# Patient Record
Sex: Male | Born: 1980 | Race: Black or African American | Hispanic: No | Marital: Single | Smoking: Current every day smoker
Health system: Southern US, Community
[De-identification: ages and names within clinical notes are randomized; demographics above are authoritative.]

## PROBLEM LIST (undated history)

## (undated) DIAGNOSIS — J45909 Unspecified asthma, uncomplicated: Secondary | ICD-10-CM

---

## 2002-12-28 ENCOUNTER — Emergency Department (HOSPITAL_COMMUNITY): Admission: EM | Admit: 2002-12-28 | Discharge: 2002-12-28 | Payer: Self-pay

## 2003-10-03 ENCOUNTER — Emergency Department (HOSPITAL_COMMUNITY): Admission: EM | Admit: 2003-10-03 | Discharge: 2003-10-03 | Payer: Self-pay | Admitting: Emergency Medicine

## 2006-07-15 ENCOUNTER — Emergency Department (HOSPITAL_COMMUNITY): Admission: EM | Admit: 2006-07-15 | Discharge: 2006-07-16 | Payer: Self-pay | Admitting: Emergency Medicine

## 2007-04-15 ENCOUNTER — Emergency Department (HOSPITAL_COMMUNITY): Admission: EM | Admit: 2007-04-15 | Discharge: 2007-04-15 | Payer: Self-pay | Admitting: Emergency Medicine

## 2011-11-02 ENCOUNTER — Emergency Department (HOSPITAL_COMMUNITY)
Admission: EM | Admit: 2011-11-02 | Discharge: 2011-11-02 | Disposition: A | Discharge status: Home / Self Care | Payer: No Typology Code available for payment source | Attending: Emergency Medicine | Admitting: Emergency Medicine

## 2011-11-02 ENCOUNTER — Emergency Department (HOSPITAL_COMMUNITY): Payer: No Typology Code available for payment source

## 2011-11-02 ENCOUNTER — Encounter (HOSPITAL_COMMUNITY): Payer: Self-pay | Admitting: Emergency Medicine

## 2011-11-02 DIAGNOSIS — J45909 Unspecified asthma, uncomplicated: Secondary | ICD-10-CM | POA: Insufficient documentation

## 2011-11-02 DIAGNOSIS — M549 Dorsalgia, unspecified: Secondary | ICD-10-CM | POA: Insufficient documentation

## 2011-11-02 DIAGNOSIS — F17200 Nicotine dependence, unspecified, uncomplicated: Secondary | ICD-10-CM | POA: Insufficient documentation

## 2011-11-02 DIAGNOSIS — M542 Cervicalgia: Secondary | ICD-10-CM | POA: Insufficient documentation

## 2011-11-02 HISTORY — DX: Unspecified asthma, uncomplicated: J45.909

## 2011-11-02 MED ORDER — HYDROMORPHONE HCL PF 1 MG/ML IJ SOLN
1.0000 mg | Freq: Once | INTRAMUSCULAR | Status: AC
  Administered 2011-11-02: 1 mg via INTRAVENOUS
  Filled 2011-11-02: qty 1

## 2011-11-02 MED ORDER — KETOROLAC TROMETHAMINE 30 MG/ML IJ SOLN
30.0000 mg | Freq: Once | INTRAMUSCULAR | Status: AC
  Administered 2011-11-02: 30 mg via INTRAVENOUS
  Filled 2011-11-02: qty 1

## 2011-11-02 MED ORDER — ALBUTEROL SULFATE HFA 108 (90 BASE) MCG/ACT IN AERS: 2.0000 | INHALATION_SPRAY | RESPIRATORY_TRACT | Status: DC | PRN

## 2011-11-02 MED ORDER — CYCLOBENZAPRINE HCL 10 MG PO TABS: 10.0000 mg | ORAL_TABLET | Freq: Three times a day (TID) | ORAL | Status: DC | PRN

## 2011-11-02 MED ORDER — IBUPROFEN 800 MG PO TABS: 800.0000 mg | ORAL_TABLET | Freq: Three times a day (TID) | ORAL | Status: DC

## 2011-11-02 MED ORDER — CYCLOBENZAPRINE HCL 10 MG PO TABS
10.0000 mg | ORAL_TABLET | Freq: Once | ORAL | Status: AC
  Administered 2011-11-02: 10 mg via ORAL
  Filled 2011-11-02: qty 1

## 2011-11-02 MED ORDER — ONDANSETRON HCL 4 MG/2ML IJ SOLN
4.0000 mg | Freq: Once | INTRAMUSCULAR | Status: AC
  Administered 2011-11-02: 4 mg via INTRAVENOUS
  Filled 2011-11-02: qty 2

## 2011-11-02 MED ORDER — HYDROCODONE-ACETAMINOPHEN 5-325 MG PO TABS: 1.0000 | ORAL_TABLET | Freq: Four times a day (QID) | ORAL | Status: DC | PRN

## 2011-11-02 NOTE — ED Notes (Signed)
EDP into room 

## 2011-11-02 NOTE — ED Notes (Signed)
No changes, to xray.
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
## 2011-11-02 NOTE — ED Notes (Signed)
No changes, "neck feels better", resting.
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
## 2011-11-02 NOTE — ED Notes (Signed)
Pt refused to sign at d/c until he could read all paperwork. Out in w/c to d/c desk and parking lot, out with RN & friend, given Rx x3.

## 2011-11-03 ENCOUNTER — Encounter (HOSPITAL_COMMUNITY): Payer: Self-pay | Admitting: Emergency Medicine

## 2011-11-03 ENCOUNTER — Emergency Department (HOSPITAL_COMMUNITY)
Admission: EM | Admit: 2011-11-03 | Discharge: 2011-11-04 | Disposition: A | Discharge status: Home / Self Care | Payer: No Typology Code available for payment source | Attending: Emergency Medicine | Admitting: Emergency Medicine

## 2011-11-03 DIAGNOSIS — J45909 Unspecified asthma, uncomplicated: Secondary | ICD-10-CM | POA: Insufficient documentation

## 2011-11-03 DIAGNOSIS — F17200 Nicotine dependence, unspecified, uncomplicated: Secondary | ICD-10-CM | POA: Insufficient documentation

## 2011-11-03 DIAGNOSIS — M25569 Pain in unspecified knee: Secondary | ICD-10-CM | POA: Insufficient documentation

## 2011-11-03 DIAGNOSIS — T148XXA Other injury of unspecified body region, initial encounter: Secondary | ICD-10-CM | POA: Insufficient documentation

## 2011-11-03 MED ORDER — HYDROCODONE-ACETAMINOPHEN 5-325 MG PO TABS
1.0000 | ORAL_TABLET | Freq: Once | ORAL | Status: AC
  Administered 2011-11-04: 1 via ORAL
  Filled 2011-11-03: qty 1
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
## 2011-11-04 NOTE — Progress Notes (Signed)
Orthopedic Tech Progress Note Patient Details:  John Sellers [birth year] [id number]  Ortho Devices Type of Ortho Device: Crutches;Knee Sleeve   [name] 11/04/2011, 12:04 AM

## 2011-11-06 NOTE — ED Provider Notes (Signed)
Medical screening examination/treatment/procedure(s) were performed by non-physician practitioner and as supervising physician I was immediately available for consultation/collaboration.  Cecilee Rosner, MD 11/06/11 1115 
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
## 2013-05-03 ENCOUNTER — Emergency Department (HOSPITAL_COMMUNITY)
Admission: EM | Admit: 2013-05-03 | Discharge: 2013-05-03 | Disposition: A | Discharge status: Home / Self Care | Payer: Self-pay | Source: Home / Self Care | Attending: Emergency Medicine | Admitting: Emergency Medicine

## 2013-05-03 ENCOUNTER — Other Ambulatory Visit (HOSPITAL_COMMUNITY)
Admission: RE | Admit: 2013-05-03 | Discharge: 2013-05-03 | Disposition: A | Discharge status: Home / Self Care | Payer: Self-pay | Source: Ambulatory Visit | Attending: Emergency Medicine | Admitting: Emergency Medicine

## 2013-05-03 ENCOUNTER — Encounter (HOSPITAL_COMMUNITY): Payer: Self-pay | Admitting: Emergency Medicine

## 2013-05-03 DIAGNOSIS — N342 Other urethritis: Secondary | ICD-10-CM

## 2013-05-03 DIAGNOSIS — Z113 Encounter for screening for infections with a predominantly sexual mode of transmission: Secondary | ICD-10-CM | POA: Insufficient documentation

## 2013-05-03 DIAGNOSIS — R109 Unspecified abdominal pain: Secondary | ICD-10-CM

## 2013-05-03 LAB — POCT URINALYSIS DIP (DEVICE)
Bilirubin Urine: NEGATIVE
Glucose, UA: NEGATIVE mg/dL
Ketones, ur: NEGATIVE mg/dL
LEUKOCYTES UA: NEGATIVE
Nitrite: NEGATIVE
PROTEIN: NEGATIVE mg/dL
Specific Gravity, Urine: 1.02 (ref 1.005–1.030)
UROBILINOGEN UA: 2 mg/dL — AB (ref 0.0–1.0)
pH: 7 (ref 5.0–8.0)

## 2013-05-03 LAB — HIV ANTIBODY (ROUTINE TESTING W REFLEX): HIV 1&2 Ab, 4th Generation: NONREACTIVE

## 2013-05-03 LAB — RPR

## 2013-05-03 MED ORDER — AZITHROMYCIN 250 MG PO TABS
ORAL_TABLET | ORAL | Status: AC
  Filled 2013-05-03: qty 4

## 2013-05-03 MED ORDER — CEFTRIAXONE SODIUM 250 MG IJ SOLR
INTRAMUSCULAR | Status: AC
  Filled 2013-05-03: qty 250

## 2013-05-03 MED ORDER — LIDOCAINE HCL (PF) 1 % IJ SOLN
INTRAMUSCULAR | Status: AC
  Filled 2013-05-03: qty 5

## 2013-05-03 MED ORDER — CEFTRIAXONE SODIUM 250 MG IJ SOLR
250.0000 mg | Freq: Once | INTRAMUSCULAR | Status: AC
  Administered 2013-05-03: 250 mg via INTRAMUSCULAR

## 2013-05-03 MED ORDER — AZITHROMYCIN 250 MG PO TABS
1000.0000 mg | ORAL_TABLET | Freq: Once | ORAL | Status: AC
  Administered 2013-05-03: 1000 mg via ORAL

## 2013-05-03 NOTE — ED Provider Notes (Signed)
Chief Complaint   Chief Complaint  Patient presents with  . Abdominal Pain    History of Present Illness   John Sellers is a 33 year old male who is concerned about STD exposures. Recently he broke up with her long-time partner has a new partner. Ever since they've been together he's had slight burning at the tip of the urethra. He denies any urethral discharge, burning with urination, or penile lesions. He's been concerned that over the last 3 weeks he's had sharp pains in the right and left flanks that can last for a couple seconds at a time. He's also had sweats and some nausea but no fevers, vomiting, or diarrhea. Is been no blood in the urine.  Review of Systems   Other than as noted above, the patient denies any of the following symptoms: Systemic:  No fevers chills, arthralgias, or adenopathy. GI:  No abdominal pain, nausea or vomiting. GU:  No dysuria, penile pain, discharge, itching, dysuria, genital lesions, testicular pain or swelling. Skin:  No rash or itching.  PMFSH   Past medical history, family history, social history, meds, and allergies were reviewed.   Physical Examination    Vital signs:  BP 133/89  Pulse 65  Temp(Src) 98.9 F (37.2 C) (Oral)  Resp 16  SpO2 99% Gen:  Alert, oriented, in no distress. Abdomen:  Soft and flat, non-distended, and non-tender.  No organomegaly or mass. Genital:  No urethral discharge, no lesions on the penis, no inguinal lymphadenopathy, testes were normal. DNA probes for gonorrhea, Chlamydia, Trichomonas were obtained. Skin:  Warm and dry.  No rash.   Labs   Results for orders placed during the hospital encounter of 05/03/13  POCT URINALYSIS DIP (DEVICE)      Result Value Ref Range   Glucose, UA NEGATIVE  NEGATIVE mg/dL   Bilirubin Urine NEGATIVE  NEGATIVE   Ketones, ur NEGATIVE  NEGATIVE mg/dL   Specific Gravity, Urine 1.020  1.005 - 1.030   Hgb urine dipstick TRACE (*) NEGATIVE   pH 7.0  5.0 - 8.0   Protein, ur  NEGATIVE  NEGATIVE mg/dL   Urobilinogen, UA 2.0 (*) 0.0 - 1.0 mg/dL   Nitrite NEGATIVE  NEGATIVE   Leukocytes, UA NEGATIVE  NEGATIVE    The urine was cultured. Serologies for HIV and RPR were obtained.  Course in Urgent Care Center   At patient's request, he was given Rocephin 250 mg IM and azithromycin 1000 mg by mouth.   Assessment   The primary encounter diagnosis was Flank pain. A diagnosis of Urethritis was also pertinent to this visit.  Plan    1.  Meds:  The following meds were prescribed:   Discharge Medication List as of 05/03/2013 11:44 AM      2.  Patient Education/Counseling:  The patient was given appropriate handouts, self care instructions, and instructed in symptomatic relief.The patient was instructed to inform all sexual contacts, avoid intercourse completely for 2 weeks and then only with a condom.  The patient was told that we would call about all abnormal lab results, and that we would need to report certain kinds of infection to the health department.    3.  Follow up:  The patient was told to follow up here if no better in 3 to 4 days, or sooner if becoming worse in any way, and given some red flag symptoms such as fever, pain, or difficulty urinating which would prompt immediate return.       Reuben Likes,  MD 05/03/13 1609

## 2013-05-03 NOTE — Discharge Instructions (Signed)
You have been diagnosed with a possible STD.  Your results should be back in  1 - 3 days.  We will call you with the results of any positive tests, so if you don't hear from us, you can assume your results are all negative.  If you wish, you can call us here at [phone] and ask for a nurse to give you the results.  They can give you the results of all your tests over the phone.  If your HIV should come back positive, we must give you this result in person to protect your confidentiality.  We can give you a negative HIV result over the phone.   ° °In the meantime, you should avoid intercourse altogether for 1 week.  After that, you should always use condoms--100% of the time.  This will not only prevent pregnancy, but has been shown to prevent HIV, syphilis, gonorrhea, chlamydia, hepatis C and other STDs. ° °If your test comes back positive, we are required by law to report it to the Health Department.  We also suggest you inform your partner or partners so they can get tested and treated as well. ° °You can get STD testing for free at the [facility].  It is recommended that you have repeat testing for HIV and syphilis in 3 and 6 months, since it can take a while for these tests to become positive.  ° °

## 2013-05-03 NOTE — ED Notes (Signed)
Patient states he has not felt well for 3 weeks. C/o episodic nausea, but no vomiting, mid/upper abdominal pain. No one else at home ill. After direct discussion , pt states he is here for STD testing

## 2013-05-04 LAB — URINE CULTURE: Colony Count: 2000

## 2013-05-05 LAB — URINE CYTOLOGY ANCILLARY ONLY
CHLAMYDIA, DNA PROBE: POSITIVE — AB
NEISSERIA GONORRHEA: NEGATIVE

## 2013-05-05 NOTE — ED Notes (Signed)
After verifying  pts    Id  Lab  Results   That  Have  Resulted  At this  Time     Were  Given to pt    Verbally  In person

## 2013-05-05 NOTE — ED Notes (Signed)
Accessed record for patient phone call 

## 2013-05-08 ENCOUNTER — Telehealth (HOSPITAL_COMMUNITY): Payer: Self-pay | Admitting: *Deleted

## 2013-05-08 NOTE — ED Notes (Signed)
GC neg., Chlamydia pos., HIV/RPR non-reactive, Urine culture: insignificant growth.  I called pt. Pt. verified x 2 and given results.  Pt. told he was adequately treated with Zithromax and Rocephin.  Pt. instructed to notify his partner, no sex for 1 week and to practice safe sex. Pt. told he can get HIV [facility], by appointment. Pt. voiced understanding. DHHS [facility]. Desiree LucySuzanne M [facility] 05/08/2013

## 2013-05-09 NOTE — ED Notes (Signed)
DHHS [facility]. Desiree LucySuzanne M [facility] 05/09/2013

## 2015-01-31 ENCOUNTER — Emergency Department (HOSPITAL_COMMUNITY)
Admission: EM | Admit: 2015-01-31 | Discharge: 2015-01-31 | Disposition: A | Discharge status: Home / Self Care | Payer: Self-pay | Attending: Emergency Medicine | Admitting: Emergency Medicine

## 2015-01-31 ENCOUNTER — Encounter (HOSPITAL_COMMUNITY): Payer: Self-pay | Admitting: *Deleted

## 2015-01-31 DIAGNOSIS — Z791 Long term (current) use of non-steroidal anti-inflammatories (NSAID): Secondary | ICD-10-CM | POA: Insufficient documentation

## 2015-01-31 DIAGNOSIS — J45901 Unspecified asthma with (acute) exacerbation: Secondary | ICD-10-CM | POA: Insufficient documentation

## 2015-01-31 DIAGNOSIS — J209 Acute bronchitis, unspecified: Secondary | ICD-10-CM | POA: Insufficient documentation

## 2015-01-31 DIAGNOSIS — J029 Acute pharyngitis, unspecified: Secondary | ICD-10-CM | POA: Insufficient documentation

## 2015-01-31 DIAGNOSIS — J40 Bronchitis, not specified as acute or chronic: Secondary | ICD-10-CM

## 2015-01-31 DIAGNOSIS — F17210 Nicotine dependence, cigarettes, uncomplicated: Secondary | ICD-10-CM | POA: Insufficient documentation

## 2015-01-31 LAB — RAPID STREP SCREEN (MED CTR MEBANE ONLY): STREPTOCOCCUS, GROUP A SCREEN (DIRECT): NEGATIVE

## 2015-01-31 MED ORDER — ALBUTEROL SULFATE HFA 108 (90 BASE) MCG/ACT IN AERS
2.0000 | INHALATION_SPRAY | Freq: Once | RESPIRATORY_TRACT | Status: AC
  Administered 2015-01-31: 2 via RESPIRATORY_TRACT
  Filled 2015-01-31: qty 6.7

## 2015-01-31 MED ORDER — NAPROXEN 500 MG PO TABS: 500.0000 mg | ORAL_TABLET | Freq: Two times a day (BID) | ORAL | Status: DC

## 2015-01-31 NOTE — ED Provider Notes (Signed)
CSN: [id number]     Arrival date & time 01/31/15  1803 History  By signing my name below, I, Freida Busman, attest that this documentation has been prepared under the direction and in the presence of non-physician practitioner, Rhea Bleacher PA-C. Electronically Signed: Freida Busman, Scribe. 01/31/2015. 7:43 PM.    Chief Complaint  Patient presents with  . Sore Throat   The history is provided by the patient. No language interpreter was used.   HPI Comments: John Sellers is a 35 y.o. male with a history of asthma, who presents to the Emergency Department complaining of sore throat x ~ 2 days with 8/10 pain.  He reports associated productive cough, and wheezing. He has been using home neb with mild relief. He denies recent fever, congestion, chest tightness, CP, nausea, diarrhea, and vomiting. He notes sick contacts with similar symptoms- daughter.   Past Medical History  Diagnosis Date  . Asthma    History reviewed. No pertinent past surgical history. No family history on file. Social History  Substance Use Topics  . Smoking status: Current Every Day Smoker -- 0.50 packs/day    Types: Cigarettes  . Smokeless tobacco: None  . Alcohol Use: Yes    Review of Systems  Constitutional: Negative for fever, chills and fatigue.  HENT: Positive for sore throat. Negative for congestion, ear pain, rhinorrhea and sinus pressure.   Eyes: Negative for redness.  Respiratory: Positive for cough and wheezing. Negative for chest tightness.   Gastrointestinal: Negative for nausea, vomiting, abdominal pain and diarrhea.  Genitourinary: Negative for dysuria.  Musculoskeletal: Negative for myalgias and neck stiffness.  Skin: Negative for rash.  Neurological: Negative for headaches.  Hematological: Negative for adenopathy.   Allergies  Review of patient's allergies indicates no known allergies.  Home Medications   Prior to Admission medications   Medication Sig Start Date End Date Taking?  Authorizing Provider  cyclobenzaprine (FLEXERIL) 10 MG tablet Take 10 mg by mouth 3 (three) times daily as needed. For muscle spasms 11/02/11   Sunnie Nielsen, MD  HYDROcodone-acetaminophen (NORCO/VICODIN) 5-325 MG per tablet Take 1 tablet by mouth every 6 (six) hours as needed. For pain 11/02/11   Sunnie Nielsen, MD  ibuprofen (ADVIL,MOTRIN) 800 MG tablet Take 1 tablet (800 mg total) by mouth 3 (three) times daily. 11/02/11   Sunnie Nielsen, MD   BP 126/81 mmHg  Pulse 90  Temp(Src) 98.3 F (36.8 C) (Oral)  Resp 18  SpO2 97% Physical Exam  Constitutional: He is oriented to person, place, and time. He appears well-developed and well-nourished. No distress.  HENT:  Head: Normocephalic and atraumatic.  Right Ear: Tympanic membrane, external ear and ear canal normal.  Left Ear: Tympanic membrane, external ear and ear canal normal.  Nose: Nose normal. No mucosal edema or rhinorrhea.  Mouth/Throat: Uvula is midline and mucous membranes are normal. Mucous membranes are not dry. No trismus in the jaw. No uvula swelling. Posterior oropharyngeal erythema present. No oropharyngeal exudate, posterior oropharyngeal edema or tonsillar abscesses.  Eyes: Conjunctivae are normal. Right eye exhibits no discharge. Left eye exhibits no discharge.  Neck: Normal range of motion. Neck supple.  Cardiovascular: Normal rate, regular rhythm and normal heart sounds.   Pulmonary/Chest: Effort normal. No respiratory distress. He has wheezes (Mild, end-expiratory scattered). He has no rales.  Abdominal: Soft. He exhibits no distension. There is no tenderness.  Lymphadenopathy:    He has cervical adenopathy.  Neurological: He is alert and oriented to person, place, and time.  Skin: Skin  is warm and dry.  Psychiatric: He has a normal mood and affect.  Nursing note and vitals reviewed.   ED Course  Procedures   DIAGNOSTIC STUDIES: Oxygen Saturation is 97% on RA, normal by my interpretation.    COORDINATION OF CARE:  7:40  PM Will discharge with albuterol inhaler. Advised pt to use tylenol and motrin. Discussed treatment plan with pt at bedside and pt agreed to plan.  Labs Review Labs Reviewed  RAPID STREP SCREEN (NOT AT [facility])    I have  personally reviewed and evaluated these lab results as part of her medical decision-making.  Vital signs reviewed and are as follows: Filed Vitals:   01/31/15 1820  BP: 126/81  Pulse: 90  Temp: 98.3 F (36.8 C)  Resp: 18    8:24 PM Patient counseled on supportive care for viral URI and s/s to return including worsening symptoms, persistent fever, persistent vomiting, or if they have any other concerns. Urged to see PCP if symptoms persist for more than 3 days. Patient verbalizes understanding and agrees with plan.     MDM   Final diagnoses:  Pharyngitis  Bronchitis   Pharyngitis: Strep negative. No fevers, neck pain, trismus suggest more significant infection.  Bronchitis: No concern for PNA given normal lung exam. Antibiotics not indicated. Conservative therapy indicated.    I personally performed the services described in this documentation, which was scribed in my presence. The recorded information has been reviewed and is accurate.   Renne CriglerJoshua Ziaire Hagos, PA-C 01/31/15 2025  Zadie Rhineonald Wickline, MD 02/01/15 [phone]

## 2015-01-31 NOTE — Discharge Instructions (Signed)
Please read and follow all provided instructions.  Your diagnoses today include:  1. Pharyngitis   2. Bronchitis     Tests performed today include:  Strep test: was negative for strep throat  Strep culture: you will be notified if this comes back positive  Vital signs. See below for your results today.   Medications prescribed:   Albuterol inhaler - medication that opens up your airway  Use inhaler as follows: 1-2 puffs with spacer every 4 hours as needed for wheezing, cough, or shortness of breath.    Naproxen - anti-inflammatory pain medication  Do not exceed 500mg  naproxen every 12 hours, take with food  You have been prescribed an anti-inflammatory medication or NSAID. Take with food. Take smallest effective dose for the shortest duration needed for your pain. Stop taking if you experience stomach pain or vomiting.    Home care instructions:  Please read the educational materials provided and follow any instructions contained in this packet.  Follow-up instructions: Please follow-up with your primary care provider as needed for further evaluation of your symptoms.  Return instructions:   Please return to the Emergency Department if you experience worsening symptoms.   Return if you have worsening problems swallowing, your neck becomes swollen, you cannot swallow your saliva or your voice becomes muffled.   Return with high persistent fever, persistent vomiting, or if you have trouble breathing.   Please return if you have any other emergent concerns.  Additional Information:  Your vital signs today were: BP 126/81 mmHg   Pulse 90   Temp(Src) 98.3 F (36.8 C) (Oral)   Resp 18   SpO2 97% If your blood pressure (BP) was elevated above 135/85 this visit, please have this repeated by your doctor within one month. --------------

## 2015-01-31 NOTE — ED Notes (Signed)
Pt c/o sore throat x2 days 

## 2015-02-03 LAB — CULTURE, GROUP A STREP: STREP A CULTURE: NEGATIVE

## 2017-01-11 ENCOUNTER — Encounter (HOSPITAL_COMMUNITY): Payer: Self-pay | Admitting: Emergency Medicine

## 2017-01-11 ENCOUNTER — Emergency Department (HOSPITAL_COMMUNITY)
Admission: EM | Admit: 2017-01-11 | Discharge: 2017-01-11 | Disposition: A | Discharge status: Home / Self Care | Payer: Self-pay | Attending: Emergency Medicine | Admitting: Emergency Medicine

## 2017-01-11 ENCOUNTER — Other Ambulatory Visit: Payer: Self-pay

## 2017-01-11 DIAGNOSIS — F17210 Nicotine dependence, cigarettes, uncomplicated: Secondary | ICD-10-CM | POA: Insufficient documentation

## 2017-01-11 DIAGNOSIS — J45909 Unspecified asthma, uncomplicated: Secondary | ICD-10-CM | POA: Insufficient documentation

## 2017-01-11 DIAGNOSIS — K0889 Other specified disorders of teeth and supporting structures: Secondary | ICD-10-CM | POA: Insufficient documentation

## 2017-01-11 MED ORDER — ACETAMINOPHEN 500 MG PO TABS: 500.0000 mg | ORAL_TABLET | Freq: Four times a day (QID) | ORAL | 0 refills | Status: AC | PRN

## 2017-01-11 MED ORDER — PENICILLIN V POTASSIUM 500 MG PO TABS: 500.0000 mg | ORAL_TABLET | Freq: Four times a day (QID) | ORAL | 0 refills | Status: AC

## 2017-01-11 MED ORDER — NAPROXEN 500 MG PO TABS: 500.0000 mg | ORAL_TABLET | Freq: Two times a day (BID) | ORAL | 0 refills | Status: DC

## 2017-01-11 NOTE — ED Provider Notes (Signed)
[facility] Provider Note   CSN: [id number] Arrival date & time: 01/11/17  [id number]     History   Chief Complaint Chief Complaint  Patient presents with  . Dental Pain    HPI John Sellers is a 36 y.o. male who presents with a 4-day history of upper and lower right dental pain.  Patient has been taking ibuprofen and using ice without relief.  Patient reports he has had pain in the tooth before and knows he needs to get them taken care of, however he does not have a dentist or dental insurance.  He denies any fevers, however did feel chills over the weekend.  He denies any neck pain.  He has had some radiation of pain to his right temple.  HPI  Past Medical History:  Diagnosis Date  . Asthma     There are no active problems to display for this patient.   History reviewed. No pertinent surgical history.     Home Medications    Prior to Admission medications   Medication Sig Start Date End Date Taking? Authorizing Provider  acetaminophen (TYLENOL) 500 MG tablet Take 1 tablet (500 mg total) by mouth every 6 (six) hours as needed. 01/11/17   Reed Eifert, Waylan BogaAlexandra M, PA-C  naproxen (NAPROSYN) 500 MG tablet Take 1 tablet (500 mg total) by mouth 2 (two) times daily. 01/11/17   Emi HolesLaw, Dell Briner M, PA-C  penicillin v potassium (VEETID) 500 MG tablet Take 1 tablet (500 mg total) by mouth 4 (four) times daily for 7 days. 01/11/17 01/18/17  Emi HolesLaw, Gionni Vaca M, PA-C    Family History No family history on file.  Social History Social History   Tobacco Use  . Smoking status: Current Every Day Smoker    Packs/day: 0.50    Types: Cigarettes  . Smokeless tobacco: Never Used  Substance Use Topics  . Alcohol use: Yes  . Drug use: Yes    Types: Marijuana     Allergies   Patient has no known allergies.   Review of Systems Review of Systems  Constitutional: Positive for chills. Negative for fever.  HENT: Positive for dental problem.       Physical Exam Updated Vital Signs BP (!) 145/99 (BP Location: Right Arm)   Pulse 72   Temp 98.2 F (36.8 C) (Oral)   Resp 16   SpO2 100%   Physical Exam  Constitutional: He appears well-developed and well-nourished. No distress.  HENT:  Head: Normocephalic and atraumatic.  Mouth/Throat: Oropharynx is clear and moist. No trismus in the jaw. Abnormal dentition. Dental caries present. No dental abscesses. No oropharyngeal exudate, posterior oropharyngeal edema, posterior oropharyngeal erythema or tonsillar abscesses.    Eyes: Conjunctivae are normal. Pupils are equal, round, and reactive to light. Right eye exhibits no discharge. Left eye exhibits no discharge. No scleral icterus.  Neck: Normal range of motion. Neck supple. No thyromegaly present.  Cardiovascular: Normal rate, regular rhythm, normal heart sounds and intact distal pulses. Exam reveals no gallop and no friction rub.  No murmur heard. Pulmonary/Chest: Effort normal and breath sounds normal. No stridor. No respiratory distress. He has no wheezes. He has no rales.  Musculoskeletal: He exhibits no edema.  Lymphadenopathy:    He has no cervical adenopathy.  Neurological: He is alert. Coordination normal.  Skin: Skin is warm and dry. No rash noted. He is not diaphoretic. No pallor.  Psychiatric: He has a normal mood and affect.  Nursing note and vitals reviewed.  ED Treatments / Results  Labs (all labs ordered are listed, but only abnormal results are displayed) Labs Reviewed - No data to display  EKG  EKG Interpretation None       Radiology No results found.  Procedures Procedures (including critical care time)  Medications Ordered in ED Medications - No data to display   Initial Impression / Assessment and Plan / ED Course  I have reviewed the triage vital signs and the nursing notes.  Pertinent labs & imaging results that were available during my care of the patient were reviewed by me and  considered in my medical decision making (see chart for details).     Patient with dentalgia.  No abscess requiring immediate incision and drainage.  Exam not concerning for Ludwig's angina or pharyngeal abscess.  Will treat with penicillin, Naprosyn, Tylenol. Pt instructed to follow-up with dentist.  Patient given several resources. Discussed return precautions.  Patient understands and agrees with plan.  Patient vitals stable and discharged in satisfactory condition.     Final Clinical Impressions(s) / ED Diagnoses   Final diagnoses:  Pain, dental    ED Discharge Orders        Ordered    naproxen (NAPROSYN) 500 MG tablet  2 times daily     01/11/17 1153    acetaminophen (TYLENOL) 500 MG tablet  Every 6 hours PRN     01/11/17 1153    penicillin v potassium (VEETID) 500 MG tablet  4 times daily     01/11/17 [address]1153       Rosamae Rocque M, PA-C 01/11/17 1212    Shaune PollackIsaacs, Cameron, MD 01/11/17 1239

## 2017-01-11 NOTE — Discharge Instructions (Signed)
Medications: Penicillin, Naprosyn, Tylenol  Treatment: Take penicillin 4 times daily until completed.  Take Naprosyn twice daily as needed for your pain.  You can alternate with Tylenol in between.  Do not take ibuprofen and Naprosyn together.  Follow-up: Please follow-up with a dentist as soon as possible.  See the resources attached to this discharge paperwork as well as the information below. [facility] [employer]  [employer]  [employer] [facility]  [address]  [place], [place] [address]  Phone [phone]

## 2017-01-11 NOTE — ED Triage Notes (Signed)
Pt reports dental pain starting Friday with swelling to gums on R side, upper and lower. Pt reports using biotene mouthwash and ice packs to help with pain. Airway unobstructed at this time. One broken upper R molar noted, poor dental hygiene noted.

## 2017-08-24 ENCOUNTER — Ambulatory Visit (HOSPITAL_COMMUNITY)
Admission: EM | Admit: 2017-08-24 | Discharge: 2017-08-24 | Disposition: A | Discharge status: Home / Self Care | Payer: Self-pay | Attending: Family Medicine | Admitting: Family Medicine

## 2017-08-24 ENCOUNTER — Encounter (HOSPITAL_COMMUNITY): Payer: Self-pay

## 2017-08-24 DIAGNOSIS — F17210 Nicotine dependence, cigarettes, uncomplicated: Secondary | ICD-10-CM | POA: Insufficient documentation

## 2017-08-24 DIAGNOSIS — N529 Male erectile dysfunction, unspecified: Secondary | ICD-10-CM | POA: Insufficient documentation

## 2017-08-24 MED ORDER — SILDENAFIL CITRATE 100 MG PO TABS: 100.0000 mg | ORAL_TABLET | Freq: Every day | ORAL | 0 refills | Status: DC | PRN

## 2017-08-24 NOTE — ED Provider Notes (Signed)
[facility]    CSN: [id number] Arrival date & time: 08/24/17  [id number]     History   Chief Complaint Chief Complaint  Patient presents with  . feeling of not normal in penis    HPI John Sellers is a 37 y.o. male.   Patient has several questions.  Chief concern seems to be issues with sexual performance and erectile dysfunction.  There is some question about desire as well.  There is no discharge or drainage or exposure to STD that he is aware.  Also has a question about change in coloration on the penile shaft.  There are no other medical issues.  HPI  Past Medical History:  Diagnosis Date  . Asthma     There are no active problems to display for this patient.   History reviewed. No pertinent surgical history.     Home Medications    Prior to Admission medications   Medication Sig Start Date End Date Taking? Authorizing Provider  acetaminophen (TYLENOL) 500 MG tablet Take 1 tablet (500 mg total) by mouth every 6 (six) hours as needed. 01/11/17   Law, Waylan BogaAlexandra M, PA-C  naproxen (NAPROSYN) 500 MG tablet Take 1 tablet (500 mg total) by mouth 2 (two) times daily. 01/11/17   Emi HolesLaw, Alexandra M, PA-C  sildenafil (VIAGRA) 100 MG tablet Take 1 tablet (100 mg total) by mouth daily as needed for erectile dysfunction. 08/24/17   Frederica KusterMiller, Axle Parfait M, MD    Family History History reviewed. No pertinent family history.  Social History Social History   Tobacco Use  . Smoking status: Current Every Day Smoker    Packs/day: 0.50    Types: Cigarettes  . Smokeless tobacco: Never Used  Substance Use Topics  . Alcohol use: Yes  . Drug use: Yes    Types: Marijuana     Allergies   Patient has no known allergies.   Review of Systems Review of Systems  Constitutional: Negative for chills and fever.  HENT: Negative for ear pain and sore throat.   Eyes: Negative for pain and visual disturbance.  Respiratory: Negative for cough and shortness of breath.     Cardiovascular: Negative for chest pain and palpitations.  Gastrointestinal: Negative for abdominal pain and vomiting.  Genitourinary: Negative for dysuria and hematuria.  Musculoskeletal: Negative for arthralgias and back pain.  Skin: Negative for color change and rash.  Neurological: Negative for seizures and syncope.  All other systems reviewed and are negative.    Physical Exam Triage Vital Signs ED Triage Vitals  Enc Vitals Group     BP 08/24/17 1010 (!) 140/97     Pulse Rate 08/24/17 1010 65     Resp 08/24/17 1010 20     Temp 08/24/17 1010 98.1 F (36.7 C)     Temp Source 08/24/17 1010 Oral     SpO2 08/24/17 1010 98 %     Weight --      Height --      Head Circumference --      Peak Flow --      Pain Score 08/24/17 1013 0     Pain Loc --      Pain Edu? --      Excl. in [place]? --    No data found.  Updated Vital Signs BP (!) 140/97 (BP Location: Left Arm)   Pulse 65   Temp 98.1 F (36.7 C) (Oral)   Resp 20   SpO2 98%   Visual Acuity Right Eye Distance:  Left Eye Distance:   Bilateral Distance:    Right Eye Near:   Left Eye Near:    Bilateral Near:     Physical Exam  Constitutional: He appears well-developed and well-nourished.  Cardiovascular: Normal rate, regular rhythm, normal heart sounds and intact distal pulses.  Pulmonary/Chest: Effort normal and breath sounds normal.  Genitourinary:  Genitourinary Comments: There is some discoloration of the penile shaft consistent with vitiligo Patient is uncircumcised     UC Treatments / Results  Labs (all labs ordered are listed, but only abnormal results are displayed) Labs Reviewed  RPR  HIV ANTIBODY (ROUTINE TESTING)  URINE CYTOLOGY ANCILLARY ONLY    EKG None  Radiology No results found.  Procedures Procedures (including critical care time)  Medications Ordered in UC Medications - No data to display  Initial Impression / Assessment and Plan / UC Course  I have reviewed the triage vital  signs and the nursing notes.  Pertinent labs & imaging results that were available during my care of the patient were reviewed by me and considered in my medical decision making (see chart for details).    Erectile dysfunction  Final Clinical Impressions(s) / UC Diagnoses   Final diagnoses:  Erectile dysfunction, unspecified erectile dysfunction type   Discharge Instructions   None    ED Prescriptions    Medication Sig Dispense Auth. Provider   sildenafil (VIAGRA) 100 MG tablet Take 1 tablet (100 mg total) by mouth daily as needed for erectile dysfunction. 30 tablet Frederica Kuster, MD     Controlled Substance Prescriptions [place] Controlled Substance Registry consulted? No   Frederica Kuster, MD 08/24/17 1055

## 2017-08-24 NOTE — ED Triage Notes (Signed)
Pt presents with penile dysfunction

## 2017-08-25 LAB — TESTOSTERONE: Testosterone: 481 ng/dL (ref 264–916)

## 2017-08-25 LAB — URINE CYTOLOGY ANCILLARY ONLY
CHLAMYDIA, DNA PROBE: NEGATIVE
NEISSERIA GONORRHEA: NEGATIVE

## 2017-08-25 LAB — HIV ANTIBODY (ROUTINE TESTING W REFLEX): HIV Screen 4th Generation wRfx: NONREACTIVE

## 2017-08-25 LAB — RPR: RPR Ser Ql: NONREACTIVE

## 2017-11-19 ENCOUNTER — Ambulatory Visit (HOSPITAL_COMMUNITY)
Admission: EM | Admit: 2017-11-19 | Discharge: 2017-11-19 | Disposition: A | Discharge status: Home / Self Care | Payer: Self-pay | Attending: Family Medicine | Admitting: Family Medicine

## 2017-11-19 ENCOUNTER — Encounter (HOSPITAL_COMMUNITY): Payer: Self-pay | Admitting: Emergency Medicine

## 2017-11-19 DIAGNOSIS — N529 Male erectile dysfunction, unspecified: Secondary | ICD-10-CM

## 2017-11-19 MED ORDER — SILDENAFIL CITRATE 100 MG PO TABS: 100.0000 mg | ORAL_TABLET | Freq: Every day | ORAL | 0 refills | Status: DC | PRN

## 2017-11-19 NOTE — Discharge Instructions (Addendum)
We refilled your medication  Please follow up with primary care for further management.
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
## 2019-04-17 ENCOUNTER — Other Ambulatory Visit: Payer: Self-pay

## 2019-04-17 ENCOUNTER — Encounter (HOSPITAL_COMMUNITY): Payer: Self-pay

## 2019-04-17 ENCOUNTER — Ambulatory Visit (HOSPITAL_COMMUNITY)
Admission: EM | Admit: 2019-04-17 | Discharge: 2019-04-17 | Disposition: A | Discharge status: Home / Self Care | Payer: Self-pay | Attending: Family Medicine | Admitting: Family Medicine

## 2019-04-17 DIAGNOSIS — K047 Periapical abscess without sinus: Secondary | ICD-10-CM

## 2019-04-17 MED ORDER — HYDROCODONE-ACETAMINOPHEN 5-325 MG PO TABS: 1.0000 | ORAL_TABLET | Freq: Four times a day (QID) | ORAL | 0 refills | Status: AC | PRN

## 2019-04-17 MED ORDER — HYDROCODONE-ACETAMINOPHEN 5-325 MG PO TABS: 1.0000 | ORAL_TABLET | Freq: Four times a day (QID) | ORAL | 0 refills | Status: DC | PRN

## 2019-04-17 MED ORDER — AMOXICILLIN 875 MG PO TABS: 875.0000 mg | ORAL_TABLET | Freq: Two times a day (BID) | ORAL | 2 refills | Status: AC

## 2019-04-17 NOTE — ED Triage Notes (Signed)
Pt states about 2 wks ago two of his teeth were bothering him on the lower right side of jaw. Pt states he noticed a bump on the inside of his mouth on the right side and woke up with the right side of his jaw swollen. Pt has 2+ edema of right side of jaw. Pt states it is painful. Pt has non labored breathing. Pt states he took a couple of left over amoxicllin he had from previous.

## 2019-04-17 NOTE — ED Provider Notes (Addendum)
[place]    CSN: [id number] Arrival date & time: 04/17/19  1127      History   Chief Complaint Chief Complaint  Patient presents with  . Abscess    HPI John Sellers is a 39 y.o. male.   Established [facility] patient.  Patient has recurrent dental abscess and tooth #32.  This is come up again now for the last 3 days.  He does get better when he takes amoxicillin but he cannot afford a dentist.  Pt states about 2 wks ago two of his teeth were bothering him on the lower right side of jaw. Pt states he noticed a bump on the inside of his mouth on the right side and woke up with the right side of his jaw swollen. Pt has 2+ edema of right side of jaw. Pt states it is painful. Pt has non labored breathing. Pt states he took a couple of left over amoxicllin he had from previous.  Patient works in [employer]     Past Medical History:  Diagnosis Date  . Asthma     There are no problems to display for this patient.   History reviewed. No pertinent surgical history.     Home Medications    Prior to Admission medications   Medication Sig Start Date End Date Taking? Authorizing Provider  acetaminophen (TYLENOL) 500 MG tablet Take 1 tablet (500 mg total) by mouth every 6 (six) hours as needed. 01/11/17   Law, Bea Graff, PA-C  amoxicillin (AMOXIL) 875 MG tablet Take 1 tablet (875 mg total) by mouth 2 (two) times daily. 04/17/19   Robyn Haber, MD  HYDROcodone-acetaminophen (NORCO) 5-325 MG tablet Take 1 tablet by mouth every 6 (six) hours as needed for moderate pain. 04/17/19   Robyn Haber, MD  sildenafil (VIAGRA) 100 MG tablet Take 1 tablet (100 mg total) by mouth daily as needed for erectile dysfunction. 11/19/17 04/17/19  Orvan July, NP    Family History Family History  Problem Relation Age of Onset  . Asthma Mother   . Hypertension Mother     Social History Social History   Tobacco Use  . Smoking status: Current Every Day Smoker   Packs/day: 0.50    Types: Cigarettes  . Smokeless tobacco: Never Used  Substance Use Topics  . Alcohol use: Yes    Comment: occ  . Drug use: Yes    Types: Marijuana     Allergies   Patient has no known allergies.   Review of Systems Review of Systems  HENT: Positive for dental problem.   All other systems reviewed and are negative.    Physical Exam Triage Vital Signs ED Triage Vitals  Enc Vitals Group     BP 04/17/19 1159 (!) 158/103     Pulse Rate 04/17/19 1159 69     Resp 04/17/19 1159 18     Temp 04/17/19 1159 98.3 F (36.8 C)     Temp Source 04/17/19 1159 Oral     SpO2 04/17/19 1159 98 %     Weight 04/17/19 1200 210 lb (95.3 kg)     Height 04/17/19 1200 6' (1.829 m)     Head Circumference --      Peak Flow --      Pain Score 04/17/19 1159 10     Pain Loc --      Pain Edu? --      Excl. in [place]? --    No data found.  Updated Vital Signs  BP (!) 158/103   Pulse 69   Temp 98.3 F (36.8 C) (Oral)   Resp 18   Ht 6' (1.829 m)   Wt 95.3 kg   SpO2 98%   BMI 28.48 kg/m   Physical Exam Vitals reviewed.  Constitutional:      Appearance: Normal appearance. He is normal weight.  HENT:     Mouth/Throat:     Mouth: Mucous membranes are moist.     Comments: Large cavity in tooth #32 with surrounding gingival swelling and mild trismus Eyes:     Conjunctiva/sclera: Conjunctivae normal.  Cardiovascular:     Rate and Rhythm: Normal rate.  Pulmonary:     Effort: Pulmonary effort is normal.  Musculoskeletal:        General: Normal range of motion.     Cervical back: Normal range of motion and neck supple. No tenderness.  Skin:    General: Skin is warm and dry.  Neurological:     General: No focal deficit present.     Mental Status: He is alert and oriented to person, place, and time.  Psychiatric:        Mood and Affect: Mood normal.        UC Treatments / Results  Labs (all labs ordered are listed, but only abnormal results are displayed) Labs  Reviewed - No data to display  EKG   Radiology No results found.  Procedures Procedures (including critical care time)  Medications Ordered in UC Medications - No data to display  Initial Impression / Assessment and Plan / UC Course  I have reviewed the triage vital signs and the nursing notes.  Pertinent labs & imaging results that were available during my care of the patient were reviewed by me and considered in my medical decision making (see chart for details).    Final Clinical Impressions(s) / UC Diagnoses   Final diagnoses:  Dental abscess   Discharge Instructions   None    ED Prescriptions    Medication Sig Dispense Auth. Provider   HYDROcodone-acetaminophen (NORCO) 5-325 MG tablet  (Status: Discontinued) Take 1 tablet by mouth every 6 (six) hours as needed for moderate pain. 12 tablet Elvina Sidle, MD   amoxicillin (AMOXIL) 875 MG tablet Take 1 tablet (875 mg total) by mouth 2 (two) times daily. 20 tablet Elvina Sidle, MD   HYDROcodone-acetaminophen (NORCO) 5-325 MG tablet Take 1 tablet by mouth every 6 (six) hours as needed for moderate pain. 12 tablet Elvina Sidle, MD     I have reviewed the PDMP during this encounter.   Elvina Sidle, MD 04/17/19 2536    Elvina Sidle, MD 04/17/19 1222
